# Patient Record
Sex: Male | Born: 1990 | Race: White | Hispanic: No | Marital: Single | Smoking: Current every day smoker
Health system: Southern US, Community
[De-identification: ages and names within clinical notes are randomized; demographics above are authoritative.]

## PROBLEM LIST (undated history)

## (undated) DIAGNOSIS — F329 Major depressive disorder, single episode, unspecified: Secondary | ICD-10-CM

## (undated) DIAGNOSIS — F419 Anxiety disorder, unspecified: Secondary | ICD-10-CM

---
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
## 2011-03-13 NOTE — H&P (Signed)
[facility]  Patient:    Perry Rios, Perry Rios                          MRN: [id number] Adm. Date:  [id number] Disc. Date: [id number] Attending:  Devoria Albe CC:         Ward Givens, M.D.                         History and Physical  HISTORY OF PRESENT ILLNESS:  I had the pleasure of seeing the patient in the emergency room at [facility].  He is a 20-year-old white male who is status post fall off of his scooter onto his left wrist.  He complains of left wrist pain and swelling.  He denies other injury.  He is alert and oriented and accompanied by his mother.  ALLERGIES:  None.  CURRENT MEDICATIONS:  Asthma inhaler p.r.n.  PAST MEDICAL HISTORY:  Asthma.  PAST SURGICAL HISTORY:  PE tubes.  SOCIAL HISTORY:  He does not smoke or drink.  He is in good health.  PHYSICAL EXAMINATION:  This is a 71-year-old white male alert and oriented in no acute distress.  EXTREMITIES:  The patient has left upper extremity with normal radial pulse, normal radial, medial and ulnar nerve sensation.  Normal FDP, FDS, extensor function, and EPL, and FPL function.  There are no signs of changes of compartment syndrome or elbow pain.  His elbow is nontender to bony palpation. Bilateral lower extremities and right upper extremity are atraumatic.  NECK:  Shoulder and neck are nontender.  ABDOMEN:  Soft.  PELVIS:  Is stable.  X-RAY AND LABORATORY DATA:  X-rays show a fracture of the distal radius and ulna.  There is a slight apex dorsal angulation, it is buckle type in nature.  IMPRESSION:  Left distal radius and ulna fracture, closed, without signs or symptoms of neurovascular compromise or compartment syndrome.  PLAN:  I have discussed the upper extremity predicament with he and his mother.  Following this I performed a general closed reduction and long-arm casting.  He was neurovascularly intact after the reduction and casting and was alert and oriented at the time of  discharge.  I have asked him to ice, elevate, and notify me should any problems occur.  He was given full precautions and will return to the office in one week for films to rule out displacement.  I have given them Lortab Elixir to take as needed and asked them to notify me should any problems, questions or concerns arise.  All issues have been encouraged and answered. DD:  05/17/00 TD:  05/18/00 Job: [id number]

## 2011-12-25 ENCOUNTER — Emergency Department (INDEPENDENT_AMBULATORY_CARE_PROVIDER_SITE_OTHER)
Admission: EM | Admit: 2011-12-25 | Discharge: 2011-12-25 | Disposition: A | Discharge status: Home Health | Payer: Managed Care, Other (non HMO) | Source: Home / Self Care | Attending: Family Medicine | Admitting: Family Medicine

## 2011-12-25 ENCOUNTER — Encounter: Payer: Self-pay | Admitting: *Deleted

## 2011-12-25 DIAGNOSIS — J209 Acute bronchitis, unspecified: Secondary | ICD-10-CM

## 2011-12-25 MED ORDER — ALBUTEROL SULFATE HFA 108 (90 BASE) MCG/ACT IN AERS: 2.0000 | INHALATION_SPRAY | RESPIRATORY_TRACT | Status: DC | PRN

## 2011-12-25 MED ORDER — BENZONATATE 200 MG PO CAPS: 200.0000 mg | ORAL_CAPSULE | Freq: Every day | ORAL | Status: AC

## 2011-12-25 MED ORDER — CLARITHROMYCIN 500 MG PO TABS: 500.0000 mg | ORAL_TABLET | Freq: Two times a day (BID) | ORAL | Status: AC

## 2011-12-25 NOTE — ED Notes (Signed)
Pt c/o chest congestion, productive cough, runny nose, and hoarseness x 1wk.  He has taken IBF, mucinex, dayquil and nyquil.

## 2011-12-25 NOTE — ED Provider Notes (Signed)
History     CSN: [id number]  Arrival date & time 12/25/11  1632   First MD Initiated Contact with Patient 12/25/11 1652      Chief Complaint  Patient presents with  . Cough     ) HPI Comments: Patient complains of approximately 1.5 week history of gradually progressive URI symptoms beginning with a mild sore throat (now improved), followed by progressive nasal congestion.  A cough started about one week ago.  Complains of fatigue and initial myalgias.  Cough is now worse at night and generally non-productive during the day.  There has been no pleuritic pain, but he has tightness in his anterior chest.  He has also had occasional shortness of breath and wheezing with activity.  He has been using his albuterol more than normal.  Fever began last night.  He sometimes coughs till he gags.  He has had pneumonia in the past.  The history is provided by the patient.    Past Medical History  Diagnosis Date  . Asthma     History reviewed. No pertinent past surgical history.  Family History  Problem Relation Age of Onset  . Asthma Mother   . Hypertension Mother     History  Substance Use Topics  . Smoking status: Former Games developer  . Smokeless tobacco: Not on file  . Alcohol Use: Yes      Review of Systems + sore throat, resolved + cough No pleuritic pain + wheezing + nasal congestion + post-nasal drainage ? sinus pain/pressure No itchy/red eyes ? earache No hemoptysis + SOB with activity ? fever, + chills No nausea No vomiting No abdominal pain No diarrhea No urinary symptoms No skin rashes + fatigue No myalgias No headache Used OTC meds without relief  Allergies  Peanut-containing drug products  Home Medications   Current Outpatient Rx  Name Route Sig Dispense Refill  . ALBUTEROL SULFATE HFA 108 (90 BASE) MCG/ACT IN AERS Inhalation Inhale 2 puffs into the lungs every 6 (six) hours as needed.    Marland Kitchen BENZONATATE 200 MG PO CAPS Oral Take 1 capsule (200 mg total)  by mouth at bedtime. Take as needed for cough 12 capsule 0  . CLARITHROMYCIN 500 MG PO TABS Oral Take 1 tablet (500 mg total) by mouth 2 (two) times daily. Take for one week 14 tablet 0    BP 121/74  Pulse 81  Temp(Src) 98.9 F (37.2 C) (Oral)  Resp 16  Ht 5\' 7"  (1.702 m)  Wt 160 lb 4 oz (72.689 kg)  BMI 25.10 kg/m2  SpO2 99%  Physical Exam Nursing notes and Vital Signs reviewed. Appearance:  Patient appears healthy, stated age, and in no acute distress Eyes:  Pupils are equal, round, and reactive to light and accomodation.  Extraocular movement is intact.  Conjunctivae are not inflamed  Ears:  Canals normal.  Tympanic membranes normal.  Nose:  Mildly congested turbinates.  No sinus tenderness.    Pharynx:  Normal Neck:  Supple.  Slightly tender shotty posterior nodes are palpated bilaterally  Lungs:  Clear to auscultation.  Breath sounds are equal.  Chest:  Distinct tenderness to palpation over the mid-sternum.  Heart:  Regular rate and rhythm without murmurs, rubs, or gallops.  Abdomen:  Nontender without masses or hepatosplenomegaly.  Bowel sounds are present.  No CVA or flank tenderness.  Extremities:  No edema.  No calf tenderness Skin:  No rash present.   ED Course  Procedures  none  1. Acute bronchitis; ? pertussis      MDM   Begin Biaxin, and Tessalon at bedtime. Take Mucinex D (guaifenesin with decongestant) twice daily for congestion.  Increase fluid intake, rest. May use Afrin nasal spray (or generic oxymetazoline) twice daily for about 5 days.  Also recommend using saline nasal spray several times daily and saline nasal irrigation (AYR is a common brand) Stop all antihistamines for now, and other non-prescription cough/cold preparations. Continue albuterol inhaler as needed (Given Rx for refill) Followup with PCP if not improving.        Donna Christen, MD 12/25/11 1714

## 2011-12-25 NOTE — Discharge Instructions (Signed)
Take Mucinex D (guaifenesin with decongestant) twice daily for congestion.  Increase fluid intake, rest. May use Afrin nasal spray (or generic oxymetazoline) twice daily for about 5 days.  Also recommend using saline nasal spray several times daily and saline nasal irrigation (AYR is a common brand) Stop all antihistamines for now, and other non-prescription cough/cold preparations. Continue albuterol inhaler as needed.

## 2011-12-27 ENCOUNTER — Telehealth: Payer: Self-pay

## 2017-06-09 ENCOUNTER — Encounter: Payer: Self-pay | Admitting: *Deleted

## 2017-06-09 ENCOUNTER — Emergency Department (INDEPENDENT_AMBULATORY_CARE_PROVIDER_SITE_OTHER)
Admission: EM | Admit: 2017-06-09 | Discharge: 2017-06-09 | Disposition: A | Discharge status: Home / Self Care | Payer: BLUE CROSS/BLUE SHIELD | Source: Home / Self Care | Attending: Family Medicine | Admitting: Family Medicine

## 2017-06-09 DIAGNOSIS — M545 Low back pain, unspecified: Secondary | ICD-10-CM

## 2017-06-09 DIAGNOSIS — R109 Unspecified abdominal pain: Secondary | ICD-10-CM | POA: Diagnosis not present

## 2017-06-09 DIAGNOSIS — R110 Nausea: Secondary | ICD-10-CM

## 2017-06-09 HISTORY — DX: Major depressive disorder, single episode, unspecified: F32.9

## 2017-06-09 HISTORY — DX: Anxiety disorder, unspecified: F41.9

## 2017-06-09 LAB — POCT URINALYSIS DIP (MANUAL ENTRY)
Bilirubin, UA: NEGATIVE
Blood, UA: NEGATIVE
Glucose, UA: NEGATIVE mg/dL
Ketones, POC UA: NEGATIVE mg/dL
Leukocytes, UA: NEGATIVE
Nitrite, UA: NEGATIVE
Protein Ur, POC: NEGATIVE mg/dL
Spec Grav, UA: 1.01 (ref 1.010–1.025)
Urobilinogen, UA: 0.2 E.U./dL
pH, UA: 6.5 (ref 5.0–8.0)

## 2017-06-09 MED ORDER — CYCLOBENZAPRINE HCL 5 MG PO TABS: 5.0000 mg | ORAL_TABLET | Freq: Two times a day (BID) | ORAL | 0 refills | Status: DC | PRN

## 2017-06-09 MED ORDER — ONDANSETRON HCL 4 MG PO TABS: 4.0000 mg | ORAL_TABLET | Freq: Four times a day (QID) | ORAL | 0 refills | Status: DC

## 2017-06-09 NOTE — ED Provider Notes (Signed)
[facility]    CSN: [id number] Arrival date & time: 06/09/17  1918     History   Chief Complaint Chief Complaint  Patient presents with  . Flank Pain  . Dysuria  . Chills    HPI Perry Rios is a 26 y.o. male.   HPI  Perry Rios is a 26 y.o. male presenting to UC with c/o bilateral lower back pain for 4 days that is worse in Left flank. Pain is aching and sore, 1/10 at this time.  Associated dysuria that started 2 days ago along with chills and sweats.  Pt states he initially thought back pain was from his physical job but denies known injury or increased workload.  He has not taken anything for pain as he states all OTC medications including Tylenol and Motrin cause stomach upset.  He has tried heat with mild relief. He does report mild nausea now but denies vomiting or diarrhea. Denies known fever. Denies abdominal pain. No known hx of kidney stones. Denies concern for STDs.   Past Medical History:  Diagnosis Date  . Anxiety and depression   . Asthma     There are no active problems to display for this patient.   History reviewed. No pertinent surgical history.     Home Medications    Prior to Admission medications   Medication Sig Start Date End Date Taking? Authorizing Provider  albuterol (PROVENTIL HFA;VENTOLIN HFA) 108 (90 BASE) MCG/ACT inhaler Inhale 2 puffs into the lungs every 6 (six) hours as needed.   Yes [provider]  sertraline (ZOLOFT) 100 MG tablet Take 100 mg by mouth daily.   Yes [provider]  cyclobenzaprine (FLEXERIL) 5 MG tablet Take 1-2 tablets (5-10 mg total) by mouth 2 (two) times daily as needed for muscle spasms. 06/09/17   Lurene ShadowPhelps, Harshita Bernales O, PA-C  ondansetron (ZOFRAN) 4 MG tablet Take 1 tablet (4 mg total) by mouth every 6 (six) hours. 06/09/17   Lurene ShadowPhelps, Storm Sovine O, PA-C    Family History Family History  Problem Relation Age of Onset  . Asthma Mother   . Hypertension Mother     Social History Social History    Substance Use Topics  . Smoking status: Current Every Day Smoker    Packs/day: 0.25    Types: Cigarettes  . Smokeless tobacco: Never Used  . Alcohol use Yes     Allergies   Peanut-containing drug products; Ibuprofen; and Tylenol [acetaminophen]   Review of Systems Review of Systems  Constitutional: Positive for chills. Negative for fever.  HENT: Negative for congestion, ear pain, sore throat, trouble swallowing and voice change.   Respiratory: Negative for cough and shortness of breath.   Cardiovascular: Negative for chest pain and palpitations.  Gastrointestinal: Positive for nausea. Negative for abdominal pain, diarrhea and vomiting.  Genitourinary: Positive for dysuria and flank pain (Left). Negative for decreased urine volume, difficulty urinating, discharge, hematuria and urgency.  Musculoskeletal: Positive for back pain. Negative for arthralgias and myalgias.  Skin: Negative for rash.     Physical Exam Triage Vital Signs ED Triage Vitals [06/09/17 1939]  Enc Vitals Group     BP 125/78     Pulse Rate 88     Resp      Temp 98.4 F (36.9 C)     Temp Source Oral     SpO2 99 %     Weight 159 lb (72.1 kg)     Height      Head Circumference  Peak Flow      Pain Score 1     Pain Loc      Pain Edu?      Excl. in GC?    No data found.   Updated Vital Signs BP 125/78 (BP Location: Left Arm)   Pulse 88   Temp 98.4 F (36.9 C) (Oral)   Wt 159 lb (72.1 kg)   SpO2 99%   BMI 24.90 kg/m      Physical Exam  Constitutional: He is oriented to person, place, and time. He appears well-developed and well-nourished. No distress.  HENT:  Head: Normocephalic and atraumatic.  Mouth/Throat: Oropharynx is clear and moist.  Eyes: EOM are normal.  Neck: Normal range of motion. Neck supple.  Cardiovascular: Normal rate and regular rhythm.   Pulmonary/Chest: Effort normal and breath sounds normal. No stridor. No respiratory distress. He has no wheezes. He has no rales.   Abdominal: Soft. He exhibits no distension and no mass. There is no tenderness. There is no rebound, no guarding and no CVA tenderness.  Musculoskeletal: Normal range of motion. He exhibits tenderness. He exhibits no edema.  No midline spinal  Tenderness. Mild tenderness to bilateral lower lumbar muscles.   Lymphadenopathy:    He has no cervical adenopathy.  Neurological: He is alert and oriented to person, place, and time.  Skin: Skin is warm and dry. He is not diaphoretic.  Psychiatric: He has a normal mood and affect. His behavior is normal.  Nursing note and vitals reviewed.    UC Treatments / Results  Labs (all labs ordered are listed, but only abnormal results are displayed) Labs Reviewed  URINE CULTURE  POCT URINALYSIS DIP (MANUAL ENTRY)    EKG  EKG Interpretation None       Radiology No results found.  Procedures Procedures (including critical care time)  Medications Ordered in UC Medications - No data to display   Initial Impression / Assessment and Plan / UC Course  I have reviewed the triage vital signs and the nursing notes.  Pertinent labs & imaging results that were available during my care of the patient were reviewed by me and considered in my medical decision making (see chart for details).     Pt c/o lower back pain and Left flank pain with nausea, chills, and dysuria for 2 days. UA: WNL, no evidence of hematuria or bacterial infection Question if symptoms due to muscle strain vs renal stone.  Final Clinical Impressions(s) / UC Diagnoses   Final diagnoses:  Flank pain  Acute bilateral low back pain without sciatica  Nausea without vomiting   Reassured pt of normal urine in UC.  Renal U/S ordered for future, pt plans to go to [facility] for U/S if symptoms not improving by this weekend. May try Flexeril and zofran in meantime for symptoms. F/u with PCP in 1 week if not improving.   New Prescriptions Discharge Medication List as  of 06/09/2017  7:53 PM    START taking these medications   Details  cyclobenzaprine (FLEXERIL) 5 MG tablet Take 1-2 tablets (5-10 mg total) by mouth 2 (two) times daily as needed for muscle spasms., Starting Wed 06/09/2017, Normal    ondansetron (ZOFRAN) 4 MG tablet Take 1 tablet (4 mg total) by mouth every 6 (six) hours., Starting Wed 06/09/2017, Normal         Controlled Substance Prescriptions [place] Controlled Substance Registry consulted? Not Applicable   Rolla Plate 06/10/17 1610

## 2017-06-09 NOTE — ED Triage Notes (Signed)
Patient c/o 4 days of flank pain and fatigue that he attributed to his physical job. C/o 2 days of dysuria and yesterday developed chills/sweats.

## 2017-06-10 LAB — URINE CULTURE: Organism ID, Bacteria: NO GROWTH

## 2017-06-11 ENCOUNTER — Telehealth: Payer: Self-pay | Admitting: Emergency Medicine

## 2018-08-25 ENCOUNTER — Emergency Department (INDEPENDENT_AMBULATORY_CARE_PROVIDER_SITE_OTHER)
Admission: EM | Admit: 2018-08-25 | Discharge: 2018-08-25 | Disposition: A | Discharge status: Home / Self Care | Payer: BLUE CROSS/BLUE SHIELD | Source: Home / Self Care | Attending: Family Medicine | Admitting: Family Medicine

## 2018-08-25 ENCOUNTER — Other Ambulatory Visit: Payer: Self-pay

## 2018-08-25 DIAGNOSIS — A630 Anogenital (venereal) warts: Secondary | ICD-10-CM

## 2018-08-25 DIAGNOSIS — Z23 Encounter for immunization: Secondary | ICD-10-CM

## 2018-08-25 MED ORDER — INFLUENZA VAC SPLIT QUAD 0.5 ML IM SUSY
0.5000 mL | PREFILLED_SYRINGE | Freq: Once | INTRAMUSCULAR | Status: AC
  Administered 2018-08-25: 0.5 mL via INTRAMUSCULAR

## 2018-08-25 MED ORDER — IMIQUIMOD 5 % EX CREA: TOPICAL_CREAM | CUTANEOUS | 3 refills | Status: AC

## 2018-08-25 NOTE — ED Provider Notes (Signed)
[name]    CSN: [id number] Arrival date & time: 08/25/18  1023     History   Chief Complaint Chief Complaint  Patient presents with  . Genital Warts  . Pruritis    HPI Perry Rios is a 27 y.o. male.   Patient reports recent onset of painless "bumps" on his penis.  He has had a single lesion for several years that has now increased in size.  No vesicles.  No testicular pain or swelling.  No urinary symptoms or urethral discharge.  The history is provided by the patient.    Past Medical History:  Diagnosis Date  . Anxiety and depression   . Asthma     There are no active problems to display for this patient.   History reviewed. No pertinent surgical history.     Home Medications    Prior to Admission medications   Medication Sig Start Date End Date Taking? Authorizing Provider  albuterol (PROVENTIL HFA;VENTOLIN HFA) 108 (90 BASE) MCG/ACT inhaler Inhale 2 puffs into the lungs every 6 (six) hours as needed.    [provider]  cyclobenzaprine (FLEXERIL) 5 MG tablet Take 1-2 tablets (5-10 mg total) by mouth 2 (two) times daily as needed for muscle spasms. 06/09/17   Lurene Shadow, PA-C  imiquimod Mathis Dad) 5 % cream Apply to affected area three times weekly at bedtime (up to 16 weeks) 08/25/18 08/25/19  Lattie Haw, MD  ondansetron (ZOFRAN) 4 MG tablet Take 1 tablet (4 mg total) by mouth every 6 (six) hours. 06/09/17   Lurene Shadow, PA-C  sertraline (ZOLOFT) 100 MG tablet Take 100 mg by mouth daily.    [provider]    Family History Family History  Problem Relation Age of Onset  . Asthma Mother   . Hypertension Mother     Social History Social History   Tobacco Use  . Smoking status: Current Every Day Smoker    Packs/day: 0.25    Types: Cigars  . Smokeless tobacco: Never Used  Substance Use Topics  . Alcohol use: Yes    Alcohol/week: 1.0 standard drinks    Types: 1 Glasses of wine per week  . Drug use: No      Allergies   Peanut-containing drug products; Ibuprofen; and Tylenol [acetaminophen]   Review of Systems Review of Systems  Constitutional: Negative.   HENT: Negative.   Respiratory: Negative.   Cardiovascular: Negative.   Gastrointestinal: Negative.   Genitourinary: Negative for discharge, genital sores, hematuria, penile pain, penile swelling, scrotal swelling and testicular pain.  Musculoskeletal: Negative.   Skin:       Small nontender lesions on penis     Physical Exam Triage Vital Signs ED Triage Vitals  Enc Vitals Group     BP 08/25/18 1051 130/84     Pulse Rate 08/25/18 1051 82     Resp --      Temp 08/25/18 1051 98.1 F (36.7 C)     Temp Source 08/25/18 1051 Oral     SpO2 08/25/18 1051 100 %     Weight 08/25/18 1052 159 lb (72.1 kg)     Height 08/25/18 1052 5\' 7"  (1.702 m)     Head Circumference --      Peak Flow --      Pain Score 08/25/18 1052 0     Pain Loc --      Pain Edu? --      Excl. in GC? --  No data found.  Updated Vital Signs BP 130/84 (BP Location: Right Arm)   Pulse 82   Temp 98.1 F (36.7 C) (Oral)   Ht 5\' 7"  (1.702 m)   Wt 72.1 kg   SpO2 100%   BMI 24.90 kg/m   Visual Acuity Right Eye Distance:   Left Eye Distance:   Bilateral Distance:    Right Eye Near:   Left Eye Near:    Bilateral Near:     Physical Exam  Constitutional: He appears well-developed and well-nourished. No distress.  HENT:  Head: Normocephalic.  Mouth/Throat: Oropharynx is clear and moist.  Eyes: Pupils are equal, round, and reactive to light.  Neck: Neck supple.  Cardiovascular: Normal rate.  Pulmonary/Chest: Effort normal.  Abdominal: There is no tenderness.  Genitourinary: Testes normal. No penile erythema or penile tenderness. No discharge found.     Genitourinary Comments: Dorsal base of penis has numerous small papules with verrucous surfaces.  Lymphadenopathy:    He has no cervical adenopathy. No inguinal adenopathy noted on the right  or left side.  Neurological: He is alert.  Skin: Skin is warm and dry.  Nursing note and vitals reviewed.    UC Treatments / Results  Labs (all labs ordered are listed, but only abnormal results are displayed) Labs Reviewed - No data to display  EKG None  Radiology No results found.  Procedures Procedures (including critical care time)  Medications Ordered in UC Medications - No data to display  Initial Impression / Assessment and Plan / UC Course  I have reviewed the triage vital signs and the nursing notes.  Pertinent labs & imaging results that were available during my care of the patient were reviewed by me and considered in my medical decision making (see chart for details).    Begin Aldara 5%.  Apply 3 times weekly for up to 16 weeks. Followup with dermatologist if not improving.   Final Clinical Impressions(s) / UC Diagnoses   Final diagnoses:  Condyloma acuminata   Discharge Instructions   None    ED Prescriptions    Medication Sig Dispense Auth. Provider   imiquimod (ALDARA) 5 % cream Apply to affected area three times weekly at bedtime (up to 16 weeks) 12 each Lattie Haw, MD        Lattie Haw, MD 08/28/18 1452

## 2018-08-25 NOTE — ED Triage Notes (Signed)
Pt presents today for bumps located on his penis. He denies any discharge and/or pain. He describes it as genital warts but is unsure. Perry Rios has had a single wart for years but over the last few months he noticed multiple new warts/bumps. He confirms that he has had unprotected sex with his girlfriend.

## 2018-09-10 ENCOUNTER — Emergency Department
Admission: EM | Admit: 2018-09-10 | Discharge: 2018-09-10 | Disposition: A | Discharge status: Home / Self Care | Payer: Self-pay | Source: Home / Self Care | Attending: Family Medicine | Admitting: Family Medicine

## 2018-09-10 ENCOUNTER — Other Ambulatory Visit: Payer: Self-pay

## 2018-09-10 DIAGNOSIS — R109 Unspecified abdominal pain: Secondary | ICD-10-CM

## 2018-09-10 DIAGNOSIS — K529 Noninfective gastroenteritis and colitis, unspecified: Secondary | ICD-10-CM

## 2018-09-10 DIAGNOSIS — R110 Nausea: Secondary | ICD-10-CM

## 2018-09-10 LAB — POCT CBC W AUTO DIFF (K'VILLE URGENT CARE)

## 2018-09-10 MED ORDER — OMEPRAZOLE 20 MG PO CPDR: 20.0000 mg | DELAYED_RELEASE_CAPSULE | Freq: Two times a day (BID) | ORAL | 0 refills | Status: AC

## 2018-09-10 MED ORDER — ONDANSETRON HCL 4 MG PO TABS: 4.0000 mg | ORAL_TABLET | Freq: Four times a day (QID) | ORAL | 0 refills | Status: AC

## 2018-09-10 NOTE — Discharge Instructions (Signed)
°  Please follow up with family medicine and [facility] (stomach doctor) for further evaluation and treatment of your symptoms. You will be notified of tonight's test results in 1-2 days.  Call 911 or go to the hospital if symptoms significantly worsening.

## 2018-09-10 NOTE — ED Triage Notes (Signed)
Mr. Perry Rios presents today with c/o abdominal and lower back pain 1/10, stomach pain, intermittent cramping, diarrhea with green/brown stools. He also reports feeling fatigued x1 week. He stated " I think I have a stomach ulcer". When asked regarding his food intake and any changes in dietary habits, he reported drinking more than 3 cups of coffee daily last week but has decreased caffeine intake since worsening of his symptoms. He has taken peptobismol which alleviates his pain. VS wdl. No other concerns at this time. Of note, pt had a recent visit to this clinic on 08/25/18.

## 2018-09-10 NOTE — ED Provider Notes (Signed)
[facility]    CSN: [id number] Arrival date & time: 09/10/18  1601     History   Chief Complaint Chief Complaint  Patient presents with  . Abdominal Pain    lower back pain  . Diarrhea  . Sore Throat  . Abdominal Cramping  . Fatigue    HPI Perry Rios is a 27 y.o. male.   HPI  Perry Rios is a 27 y.o. male presenting to UC with c/o 1 week of fatigue, with intermittent abdominal cramping 1/10, low back pain, and diarrhea with green/brown stools 1-2 times daily.  He is concerned he may have a stomach ulcer, he was drinking 3 cups of coffee daily but has decreased intake since feeling bad.  He has taken peptobismol "frequently" every day since symptoms started but only temporary relief.  Denies recent travel or known sick contacts.    Past Medical History:  Diagnosis Date  . Anxiety and depression   . Asthma     There are no active problems to display for this patient.   History reviewed. No pertinent surgical history.     Home Medications    Prior to Admission medications   Medication Sig Start Date End Date Taking? Authorizing Provider  albuterol (PROVENTIL HFA;VENTOLIN HFA) 108 (90 BASE) MCG/ACT inhaler Inhale 2 puffs into the lungs every 6 (six) hours as needed.    [provider]  imiquimod Mathis Dad) 5 % cream Apply to affected area three times weekly at bedtime (up to 16 weeks) 08/25/18 08/25/19  Lattie Haw, MD  omeprazole (PRILOSEC) 20 MG capsule Take 1 capsule (20 mg total) by mouth 2 (two) times daily before a meal. 09/10/18   Ahrianna Siglin O, PA-C  ondansetron (ZOFRAN) 4 MG tablet Take 1 tablet (4 mg total) by mouth every 6 (six) hours. 09/10/18   Lurene Shadow, PA-C    Family History Family History  Problem Relation Age of Onset  . Asthma Mother   . Hypertension Mother     Social History Social History   Tobacco Use  . Smoking status: Current Every Day Smoker    Packs/day: 0.25    Types: Cigars  . Smokeless tobacco:  Never Used  Substance Use Topics  . Alcohol use: Yes    Alcohol/week: 1.0 standard drinks    Types: 1 Glasses of wine per week  . Drug use: No     Allergies   Peanut-containing drug products; Ibuprofen; and Tylenol [acetaminophen]   Review of Systems Review of Systems  Constitutional: Positive for fatigue. Negative for appetite change, chills and fever.  Gastrointestinal: Positive for abdominal pain, diarrhea and nausea. Negative for constipation and vomiting.  Genitourinary: Negative for dysuria and frequency.  Musculoskeletal: Negative for back pain and myalgias.  Neurological: Positive for weakness. Negative for dizziness, light-headedness and headaches.     Physical Exam Triage Vital Signs ED Triage Vitals [09/10/18 1634]  Enc Vitals Group     BP 137/82     Pulse Rate 77     Resp 20     Temp 98.6 F (37 C)     Temp Source Oral     SpO2 99 %     Weight 155 lb (70.3 kg)     Height 5\' 7"  (1.702 m)     Head Circumference      Peak Flow      Pain Score 1     Pain Loc      Pain Edu?  Excl. in GC?    No data found.  Updated Vital Signs BP 137/82 (BP Location: Right Arm)   Pulse 77   Temp 98.6 F (37 C) (Oral)   Resp 20   Ht 5\' 7"  (1.702 m)   Wt 155 lb (70.3 kg)   SpO2 99%   BMI 24.28 kg/m   Visual Acuity Right Eye Distance:   Left Eye Distance:   Bilateral Distance:    Right Eye Near:   Left Eye Near:    Bilateral Near:     Physical Exam  Constitutional: He is oriented to person, place, and time. He appears well-developed and well-nourished.  Non-toxic appearance. He does not appear ill. No distress.  HENT:  Head: Normocephalic and atraumatic.  Eyes: EOM are normal.  Neck: Normal range of motion.  Cardiovascular: Normal rate and regular rhythm.  Pulmonary/Chest: Effort normal and breath sounds normal. No respiratory distress.  Abdominal: Soft. Normal appearance. There is tenderness in the right upper quadrant, epigastric area, periumbilical  area and left upper quadrant. There is no rigidity, no rebound, no guarding and no CVA tenderness.  Musculoskeletal: Normal range of motion.  Neurological: He is alert and oriented to person, place, and time.  Skin: Skin is warm and dry.  Psychiatric: He has a normal mood and affect. His behavior is normal.  Nursing note and vitals reviewed.    UC Treatments / Results  Labs (all labs ordered are listed, but only abnormal results are displayed) Labs Reviewed  COMPLETE METABOLIC PANEL WITH GFR - Abnormal; Notable for the following components:      Result Value   Alkaline phosphatase (APISO) 117 (*)    All other components within normal limits  POCT CBC W AUTO DIFF ([facility]) - Abnormal  LIPASE    EKG None  Radiology No results found.  Procedures Procedures (including critical care time)  Medications Ordered in UC Medications - No data to display  Initial Impression / Assessment and Plan / UC Course  I have reviewed the triage vital signs and the nursing notes.  Pertinent labs & imaging results that were available during my care of the patient were reviewed by me and considered in my medical decision making (see chart for details).     Pt c/o abdominal cramping with nausea and mild diarrhea.  CBC: unremarkable Question if gallbladder causing pt's symptoms. Doubt cholecystitis due to mild tenderness, afebrile and normal CBC but possible cholelithasis. Mother had her gallbladder removed.  CMP and lipase pending. Encouraged f/u with PCP and [facility].  Final Clinical Impressions(s) / UC Diagnoses   Final diagnoses:  Abdominal cramping  Chronic diarrhea  Nausea without vomiting     Discharge Instructions      Please follow up with family medicine and [facility] Specialist (stomach doctor) for further evaluation and treatment of your symptoms. You will be notified of tonight's test results in 1-2 days.  Call 911 or go to the hospital if  symptoms significantly worsening.    ED Prescriptions    Medication Sig Dispense Auth. Provider   omeprazole (PRILOSEC) 20 MG capsule Take 1 capsule (20 mg total) by mouth 2 (two) times daily before a meal. 60 capsule Lateefah Mallery O, PA-C   ondansetron (ZOFRAN) 4 MG tablet Take 1 tablet (4 mg total) by mouth every 6 (six) hours. 12 tablet Lurene ShadowPhelps, Wilfredo Canterbury O, PA-C     Controlled Substance Prescriptions [place] Controlled Substance Registry consulted? Not Applicable   Lurene ShadowPhelps, Allanna Bresee O, PA-C  09/11/18 1722  

## 2018-09-11 ENCOUNTER — Telehealth: Payer: Self-pay | Admitting: Emergency Medicine

## 2018-09-11 LAB — LIPASE: Lipase: 23 U/L (ref 7–60)

## 2018-09-11 LAB — COMPLETE METABOLIC PANEL WITH GFR
AG Ratio: 1.7 (calc) (ref 1.0–2.5)
ALT: 20 U/L (ref 9–46)
AST: 22 U/L (ref 10–40)
Albumin: 4.5 g/dL (ref 3.6–5.1)
Alkaline phosphatase (APISO): 117 U/L — ABNORMAL HIGH (ref 40–115)
BUN: 15 mg/dL (ref 7–25)
CO2: 30 mmol/L (ref 20–32)
Calcium: 9.9 mg/dL (ref 8.6–10.3)
Chloride: 101 mmol/L (ref 98–110)
Creat: 0.94 mg/dL (ref 0.60–1.35)
GFR, Est African American: 127 mL/min/{1.73_m2} (ref >=60)
GFR, Est Non African American: 110 mL/min/{1.73_m2} (ref >=60)
Globulin: 2.6 g/dL (calc) (ref 1.9–3.7)
Glucose, Bld: 85 mg/dL (ref 65–99)
Potassium: 4.2 mmol/L (ref 3.5–5.3)
Sodium: 139 mmol/L (ref 135–146)
Total Bilirubin: 0.5 mg/dL (ref 0.2–1.2)
Total Protein: 7.1 g/dL (ref 6.1–8.1)

## 2018-09-11 NOTE — Telephone Encounter (Signed)
Patient informed of lab results.  Patient states that he is feeling better and will follow up as needed.

## 2020-01-16 ENCOUNTER — Other Ambulatory Visit: Payer: Self-pay

## 2020-01-16 ENCOUNTER — Emergency Department (HOSPITAL_COMMUNITY): Payer: Commercial Managed Care - PPO

## 2020-01-16 ENCOUNTER — Encounter (HOSPITAL_COMMUNITY): Payer: Self-pay | Admitting: Emergency Medicine

## 2020-01-16 ENCOUNTER — Emergency Department (HOSPITAL_COMMUNITY)
Admission: EM | Admit: 2020-01-16 | Discharge: 2020-01-17 | Disposition: A | Discharge status: Home / Self Care | Payer: Commercial Managed Care - PPO | Attending: Emergency Medicine | Admitting: Emergency Medicine

## 2020-01-16 DIAGNOSIS — Z79899 Other long term (current) drug therapy: Secondary | ICD-10-CM | POA: Diagnosis not present

## 2020-01-16 DIAGNOSIS — F17210 Nicotine dependence, cigarettes, uncomplicated: Secondary | ICD-10-CM | POA: Insufficient documentation

## 2020-01-16 DIAGNOSIS — J45909 Unspecified asthma, uncomplicated: Secondary | ICD-10-CM | POA: Diagnosis not present

## 2020-01-16 DIAGNOSIS — K5900 Constipation, unspecified: Secondary | ICD-10-CM | POA: Diagnosis present

## 2020-01-16 DIAGNOSIS — Z91010 Allergy to peanuts: Secondary | ICD-10-CM | POA: Diagnosis not present

## 2020-01-16 LAB — URINALYSIS, ROUTINE W REFLEX MICROSCOPIC
Bilirubin Urine: NEGATIVE
Glucose, UA: NEGATIVE mg/dL
Hgb urine dipstick: NEGATIVE
Ketones, ur: NEGATIVE mg/dL
Leukocytes,Ua: NEGATIVE
Nitrite: NEGATIVE
Protein, ur: NEGATIVE mg/dL
Specific Gravity, Urine: 1.002 — ABNORMAL LOW (ref 1.005–1.030)
pH: 7 (ref 5.0–8.0)

## 2020-01-16 LAB — CBC
HCT: 45.9 % (ref 39.0–52.0)
Hemoglobin: 15.6 g/dL (ref 13.0–17.0)
MCH: 29.2 pg (ref 26.0–34.0)
MCHC: 34 g/dL (ref 30.0–36.0)
MCV: 85.8 fL (ref 80.0–100.0)
Platelets: 239 10*3/uL (ref 150–400)
RBC: 5.35 MIL/uL (ref 4.22–5.81)
RDW: 11.9 % (ref 11.5–15.5)
WBC: 11.3 10*3/uL — ABNORMAL HIGH (ref 4.0–10.5)
nRBC: 0 % (ref 0.0–0.2)

## 2020-01-16 LAB — COMPREHENSIVE METABOLIC PANEL
ALT: 30 U/L (ref 0–44)
AST: 30 U/L (ref 15–41)
Albumin: 4.5 g/dL (ref 3.5–5.0)
Alkaline Phosphatase: 91 U/L (ref 38–126)
Anion gap: 10 (ref 5–15)
BUN: 20 mg/dL (ref 6–20)
CO2: 29 mmol/L (ref 22–32)
Calcium: 9.4 mg/dL (ref 8.9–10.3)
Chloride: 100 mmol/L (ref 98–111)
Creatinine, Ser: 0.81 mg/dL (ref 0.61–1.24)
GFR calc Af Amer: >60 mL/min (ref >60)
GFR calc non Af Amer: >60 mL/min (ref >60)
Glucose, Bld: 105 mg/dL — ABNORMAL HIGH (ref 70–99)
Potassium: 3.9 mmol/L (ref 3.5–5.1)
Sodium: 139 mmol/L (ref 135–145)
Total Bilirubin: 0.8 mg/dL (ref 0.3–1.2)
Total Protein: 7.5 g/dL (ref 6.5–8.1)

## 2020-01-16 LAB — LIPASE, BLOOD: Lipase: 29 U/L (ref 11–51)

## 2020-01-16 MED ORDER — SODIUM CHLORIDE 0.9% FLUSH: 3.0000 mL | Freq: Once | INTRAVENOUS | Status: DC

## 2020-01-16 MED ORDER — POLYETHYLENE GLYCOL 3350 17 G PO PACK: 17.0000 g | PACK | Freq: Every day | ORAL | 0 refills | Status: AC

## 2020-01-16 MED ORDER — FLEET ENEMA 7-19 GM/118ML RE ENEM
1.0000 | ENEMA | Freq: Once | RECTAL | Status: AC
Start: 2020-01-16 — End: 2020-01-16
  Administered 2020-01-16: 1 via RECTAL
  Filled 2020-01-16: qty 1

## 2020-01-16 NOTE — ED Triage Notes (Signed)
Patient thinks he is impacted. He states he is having abdominal pain, nausea, and vomiting.

## 2020-01-16 NOTE — ED Notes (Signed)
Pt states that he had a BM and feels like he is ready to go home. PA will be informed

## 2020-01-16 NOTE — Discharge Instructions (Signed)
Take MiraLAX as directed.  As we discussed, once you start having regular bowel movements, stop the MiraLAX.  Additionally, he also can take Colace to help with stool softener.  I provided you primary care doctor follow-up in GI follow-up for further evaluation.  Return the emergency department for any worsening pain, abdominal pain, vomiting, inability to have bowel movement or any other worsening or concerning symptoms.

## 2020-01-16 NOTE — ED Provider Notes (Signed)
[facility]-EMERGENCY DEPT Provider Note   CSN: [id number] Arrival date & time: 01/16/20  2027     History Chief Complaint  Patient presents with  . Abdominal Pain  . Emesis    Perry Rios is a 29 y.o. male who presents for evaluation of constipation that has been ongoing for the last 4 days.  Patient reports he started having some lower abdominal discomfort but denies any pain.  He states that he tried using enemas with no improvement.  He also tried manual disimpaction with no improvement.  He states that he has had small liquid amount come out but has not been able to pass a bowel movement.  He does not think he has been passing flatus.  He does not have any nausea/vomiting.  He states that today, he felt like he had to press on his bladder to urinate.  No pain with urination.  The history is provided by the patient.       Past Medical History:  Diagnosis Date  . Anxiety and depression   . Asthma     There are no problems to display for this patient.   History reviewed. No pertinent surgical history.     Family History  Problem Relation Age of Onset  . Asthma Mother   . Hypertension Mother     Social History   Tobacco Use  . Smoking status: Current Every Day Smoker    Packs/day: 0.25    Types: Cigars  . Smokeless tobacco: Never Used  Substance Use Topics  . Alcohol use: Yes    Alcohol/week: 1.0 standard drinks    Types: 1 Glasses of wine per week  . Drug use: No    Home Medications Prior to Admission medications   Medication Sig Start Date End Date Taking? Authorizing Provider  albuterol (PROVENTIL HFA;VENTOLIN HFA) 108 (90 BASE) MCG/ACT inhaler Inhale 2 puffs into the lungs every 6 (six) hours as needed for wheezing or shortness of breath.    Yes [provider]  calcium carbonate (TUMS - DOSED IN MG ELEMENTAL CALCIUM) 500 MG chewable tablet Chew 1 tablet by mouth daily.   Yes [provider]  Magnesium 100 MG TABS  Take 1 tablet by mouth daily.   Yes [provider]  Probiotic CAPS Take 1 capsule by mouth daily.   Yes [provider]  S-Adenosylmethionine-B6-B12-FA (MOOD PLUS STRESS RELIEF PO) Take 1 tablet by mouth daily.   Yes [provider]  Simethicone (MYLANTA GAS MINIS) 41.667 MG CHEW Chew 1 tablet by mouth daily.   Yes [provider]  omeprazole (PRILOSEC) 20 MG capsule Take 1 capsule (20 mg total) by mouth 2 (two) times daily before a meal. Patient not taking: Reported on 01/16/2020 09/10/18   Lurene Shadow, PA-C  ondansetron (ZOFRAN) 4 MG tablet Take 1 tablet (4 mg total) by mouth every 6 (six) hours. Patient not taking: Reported on 01/16/2020 09/10/18   Lurene Shadow, PA-C  polyethylene glycol (MIRALAX) 17 g packet Take 17 g by mouth daily. 01/16/20   Maxwell Caul, PA-C    Allergies    Other, Peanut-containing drug products, Shrimp [shellfish allergy], Acetaminophen, and Ibuprofen  Review of Systems   Review of Systems  Constitutional: Negative for fever.  Respiratory: Negative for cough and shortness of breath.   Cardiovascular: Negative for chest pain.  Gastrointestinal: Positive for constipation. Negative for abdominal pain, nausea and vomiting.  Genitourinary: Negative for dysuria and hematuria.  Neurological: Negative for  headaches.  All other systems reviewed and are negative.   Physical Exam Updated Vital Signs BP 128/86 (BP Location: Left Arm)   Pulse 78   Temp 98.9 F (37.2 C) (Oral)   Resp 15   Ht 5\' 7"  (1.702 m)   Wt 77.1 kg   SpO2 96%   BMI 26.63 kg/m   Physical Exam Vitals and nursing note reviewed.  Constitutional:      Appearance: Normal appearance. He is well-developed.  HENT:     Head: Normocephalic and atraumatic.  Eyes:     General: Lids are normal.     Conjunctiva/sclera: Conjunctivae normal.     Pupils: Pupils are equal, round, and reactive to light.  Cardiovascular:     Rate and Rhythm: Normal rate and  regular rhythm.     Pulses: Normal pulses.     Heart sounds: Normal heart sounds. No murmur. No friction rub. No gallop.   Pulmonary:     Effort: Pulmonary effort is normal.     Breath sounds: Normal breath sounds.  Abdominal:     Palpations: Abdomen is soft. Abdomen is not rigid.     Tenderness: There is abdominal tenderness in the suprapubic area. There is no guarding.     Comments: Mild tenderness noted to suprapubic region.  No   Genitourinary:    Comments: The exam was performed with a chaperone present.  Stool noted at the more proximal end of the rectum.  He does have some mild stool that was able to be evacuated. No blood noted.  Musculoskeletal:        General: Normal range of motion.     Cervical back: Full passive range of motion without pain.  Skin:    General: Skin is warm and dry.     Capillary Refill: Capillary refill takes less than 2 seconds.  Neurological:     Mental Status: He is alert and oriented to person, place, and time.  Psychiatric:        Speech: Speech normal.     ED Results / Procedures / Treatments   Labs (all labs ordered are listed, but only abnormal results are displayed) Labs Reviewed  COMPREHENSIVE METABOLIC PANEL - Abnormal; Notable for the following components:      Result Value   Glucose, Bld 105 (*)    All other components within normal limits  CBC - Abnormal; Notable for the following components:   WBC 11.3 (*)    All other components within normal limits  URINALYSIS, ROUTINE W REFLEX MICROSCOPIC - Abnormal; Notable for the following components:   Color, Urine STRAW (*)    Specific Gravity, Urine 1.002 (*)    All other components within normal limits  LIPASE, BLOOD    EKG None  Radiology DG Abdomen 1 View  Result Date: 01/16/2020 CLINICAL DATA:  Initial evaluation for constipation for 1 week. EXAM: ABDOMEN - 1 VIEW COMPARISON:  Prior CT from 09/23/2009. FINDINGS: Bowel gas pattern within normal limits. Large volume retained  stool seen throughout the colon, with prominent stool at the rectal vault. No abnormal bowel wall thickening or free air. No soft tissue mass or abnormal calcification. Visualized lung bases are clear. Visualized osseous structures within normal limits. IMPRESSION: Large volume retained stool throughout the colon, suggesting constipation. Electronically Signed   By: 09/25/2009 M.D.   On: 01/16/2020 22:09    Procedures Procedures (including critical care time)  Medications Ordered in ED Medications  sodium chloride flush (NS) 0.9 % injection  3 mL (has no administration in time range)  sodium phosphate (FLEET) 7-19 GM/118ML enema 1 enema (1 enema Rectal Given 01/16/20 2255)    ED Course  I have reviewed the triage vital signs and the nursing notes.  Pertinent labs & imaging results that were available during my care of the patient were reviewed by me and considered in my medical decision making (see chart for details).    MDM Rules/Calculators/A&P                      29 year old male who presents for evaluation of constipation.  He reports he has not been able to have a bowel movement in about 4 days.  Triage note initially mentions nausea/vomiting but he denies any with me.  He states he came today because he was still having some discomfort felt like he had to press down on his bladder to get urine.  No fevers.  Initiate arrival, is afebrile, nontoxic-appearing.  Vital signs are stable.  He has some mild suprapubic abdominal tenderness.  Low suspicion for infectious etiology.  Low suspicion for small bowel obstruction is he has no risk factors. Additionally, he has no distention.  We will plan for KUB.  Labs ordered at triage.  KUB shows large volume of retained stool throughout colon suggesting constipation.  CBC shows leukocytosis of 11.3.  Lipase is unremarkable.  CMP is unremarkable.  UA negative for any infectious etiology.  Rectal exam attempted for fecal disimpaction.  Some  mild stool noted to the more proximal end was unable to reach it and fully disimpact.  We did break some of it up.  We will plan for enema.  Reevaluation.  Patient reports he had a bowel movement with enema and feels much better.  He is hemodynamically stable.  We will plan for MiraLAX.  Encouraged at home supportive care measures. At this time, patient exhibits no emergent life-threatening condition that require further evaluation in ED or admission. Patient had ample opportunity for questions and discussion. All patient's questions were answered with full understanding. Strict return precautions discussed. Patient expresses understanding and agreement to plan.   Portions of this note were generated with [employer]. Dictation errors may occur despite best attempts at proofreading.   Final Clinical Impression(s) / ED Diagnoses Final diagnoses:  Constipation, unspecified constipation type    Rx / DC Orders ED Discharge Orders         Ordered    polyethylene glycol (MIRALAX) 17 g packet  Daily     01/16/20 2357           Desma Mcgregor 01/17/20 0019    Carmin Muskrat, MD 01/22/20 1546

## 2020-01-16 NOTE — ED Notes (Signed)
Pt refused PIV start. Labs acquired by butterfly x2 attempts

## 2020-09-29 IMAGING — DX DG ABDOMEN 1V
1 series · 2 of 2 positions shown · non-contrast
Comparison: Prior CT from 09/23/2009.

CLINICAL DATA: Initial evaluation for constipation for 1 week.

EXAM:
ABDOMEN - 1 VIEW

[Series 1: abdomen kub · 0.14mm/px · 2 of 2 slices shown]
[im 1/2]
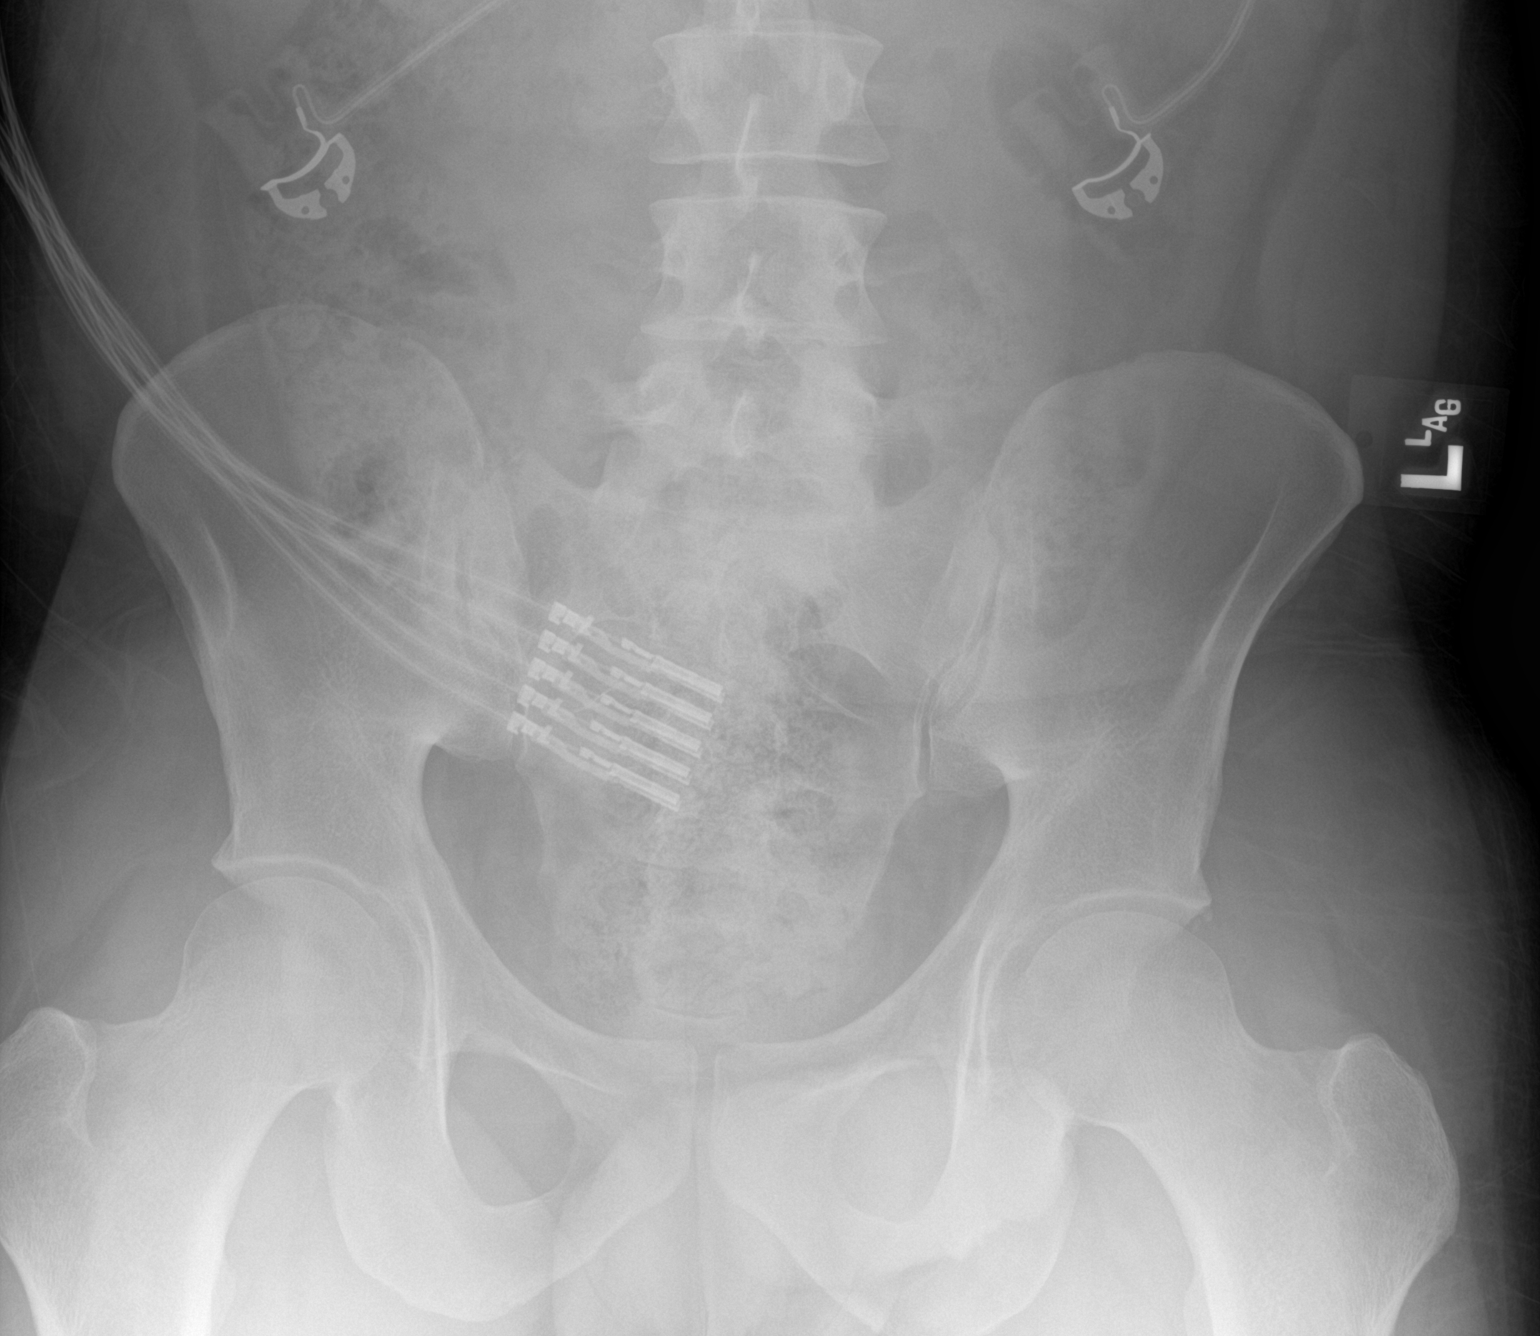
[im 2/2]
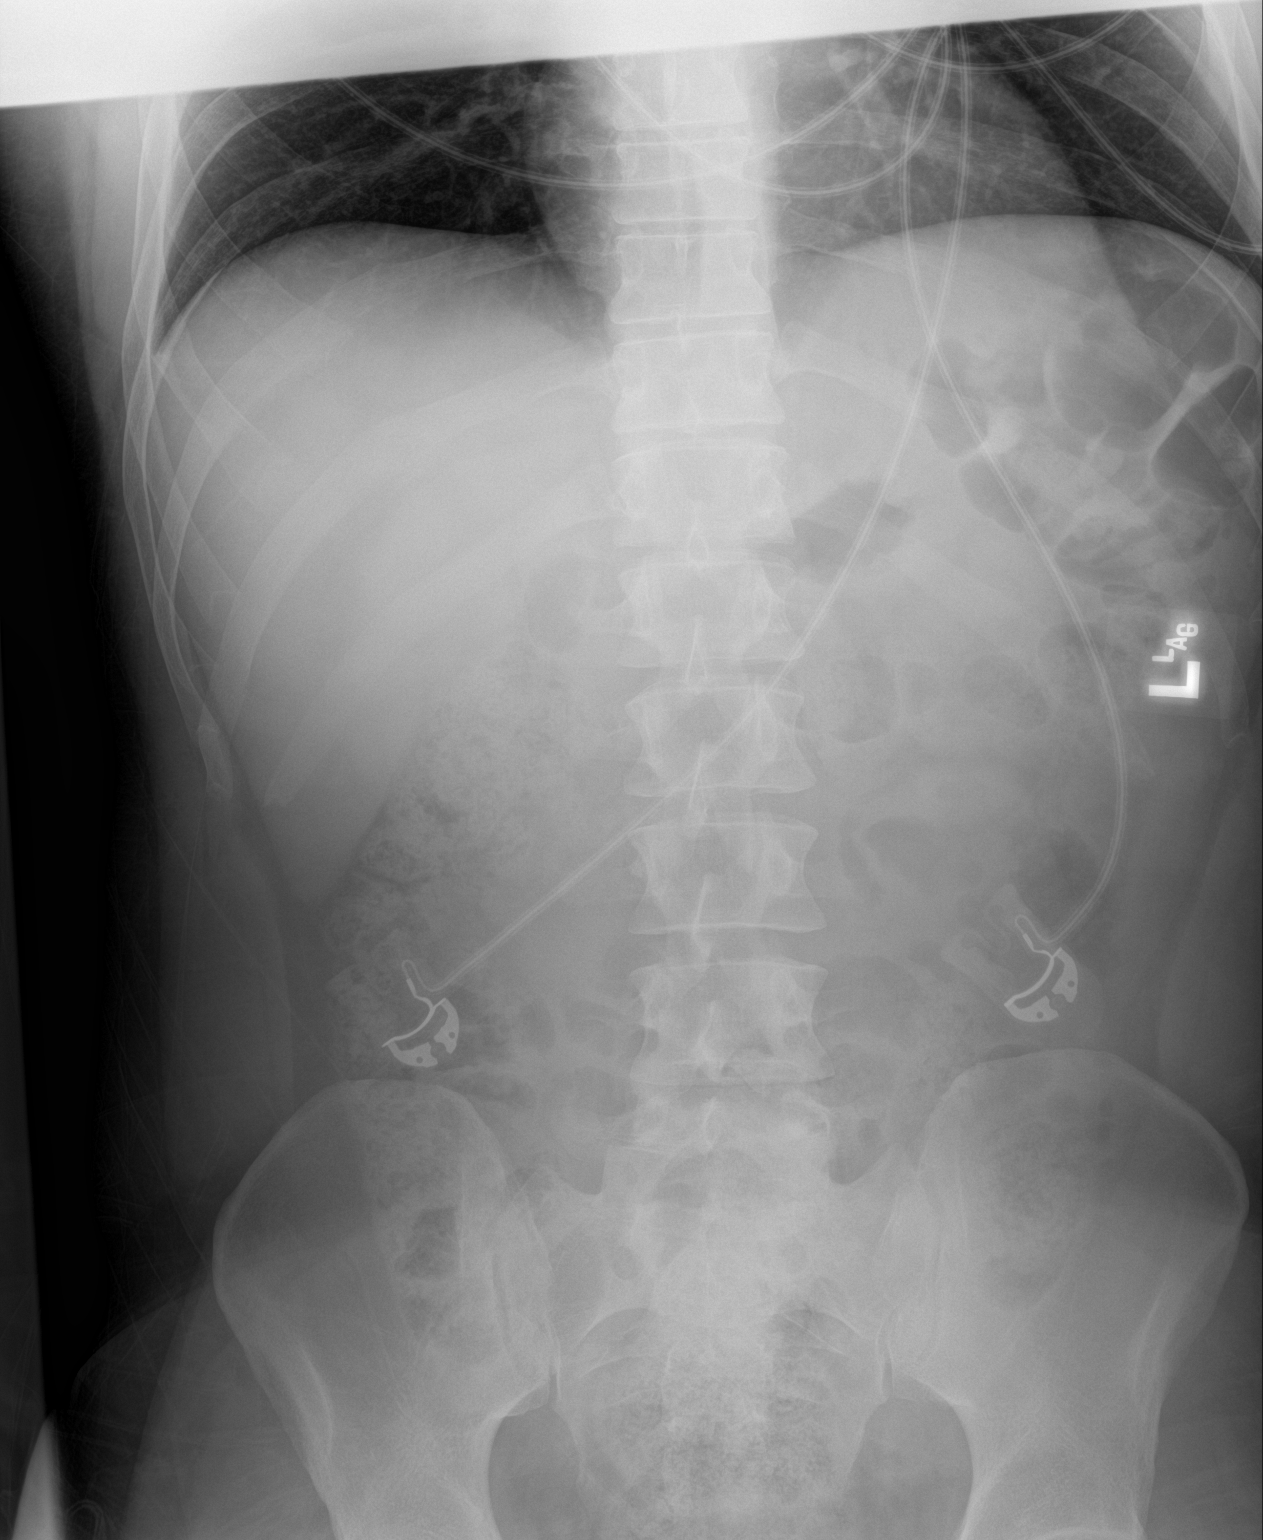

[2 of 2 positions shown; findings below may reference images not displayed]

FINDINGS: Bowel gas pattern within normal limits. Large volume retained stool
seen throughout the colon, with prominent stool at the rectal vault.
No abnormal bowel wall thickening or free air. No soft tissue mass
or abnormal calcification.

Visualized lung bases are clear. Visualized osseous structures
within normal limits.
IMPRESSION: Large volume retained stool throughout the colon, suggesting
constipation.
